# Patient Record
Sex: Male | Born: 1945 | Race: Black or African American | Hispanic: No | Marital: Married | Smoking: Current every day smoker
Health system: Southern US, Community
[De-identification: ages and names within clinical notes are randomized; demographics above are authoritative.]

## PROBLEM LIST (undated history)

## (undated) DIAGNOSIS — J449 Chronic obstructive pulmonary disease, unspecified: Secondary | ICD-10-CM

## (undated) DIAGNOSIS — I10 Essential (primary) hypertension: Secondary | ICD-10-CM

---

## 2018-03-19 ENCOUNTER — Other Ambulatory Visit: Payer: Self-pay

## 2018-03-19 ENCOUNTER — Emergency Department: Payer: Commercial Managed Care - PPO

## 2018-03-19 ENCOUNTER — Encounter: Payer: Self-pay | Admitting: Emergency Medicine

## 2018-03-19 ENCOUNTER — Observation Stay
Admission: EM | Admit: 2018-03-19 | Discharge: 2018-03-21 | Disposition: A | Discharge status: Home / Self Care | Payer: Commercial Managed Care - PPO | Attending: Internal Medicine | Admitting: Internal Medicine

## 2018-03-19 DIAGNOSIS — Z7982 Long term (current) use of aspirin: Secondary | ICD-10-CM | POA: Diagnosis not present

## 2018-03-19 DIAGNOSIS — R0902 Hypoxemia: Secondary | ICD-10-CM

## 2018-03-19 DIAGNOSIS — Z79899 Other long term (current) drug therapy: Secondary | ICD-10-CM | POA: Diagnosis not present

## 2018-03-19 DIAGNOSIS — E785 Hyperlipidemia, unspecified: Secondary | ICD-10-CM | POA: Diagnosis not present

## 2018-03-19 DIAGNOSIS — J101 Influenza due to other identified influenza virus with other respiratory manifestations: Secondary | ICD-10-CM | POA: Insufficient documentation

## 2018-03-19 DIAGNOSIS — J441 Chronic obstructive pulmonary disease with (acute) exacerbation: Secondary | ICD-10-CM | POA: Diagnosis present

## 2018-03-19 DIAGNOSIS — R0600 Dyspnea, unspecified: Secondary | ICD-10-CM

## 2018-03-19 DIAGNOSIS — I129 Hypertensive chronic kidney disease with stage 1 through stage 4 chronic kidney disease, or unspecified chronic kidney disease: Secondary | ICD-10-CM | POA: Diagnosis not present

## 2018-03-19 DIAGNOSIS — F17200 Nicotine dependence, unspecified, uncomplicated: Secondary | ICD-10-CM | POA: Insufficient documentation

## 2018-03-19 DIAGNOSIS — N183 Chronic kidney disease, stage 3 (moderate): Secondary | ICD-10-CM | POA: Diagnosis not present

## 2018-03-19 HISTORY — DX: Chronic obstructive pulmonary disease, unspecified: J44.9

## 2018-03-19 HISTORY — DX: Essential (primary) hypertension: I10

## 2018-03-19 LAB — COMPREHENSIVE METABOLIC PANEL
ALT: 9 U/L (ref 0–44)
AST: 18 U/L (ref 15–41)
Albumin: 3.2 g/dL — ABNORMAL LOW (ref 3.5–5.0)
Alkaline Phosphatase: 70 U/L (ref 38–126)
Anion gap: 6 (ref 5–15)
BUN: 20 mg/dL (ref 8–23)
CO2: 30 mmol/L (ref 22–32)
Calcium: 8.2 mg/dL — ABNORMAL LOW (ref 8.9–10.3)
Chloride: 105 mmol/L (ref 98–111)
Creatinine, Ser: 1.46 mg/dL — ABNORMAL HIGH (ref 0.61–1.24)
GFR calc Af Amer: 56 mL/min — ABNORMAL LOW (ref >60)
GFR calc non Af Amer: 48 mL/min — ABNORMAL LOW (ref >60)
GLUCOSE: 110 mg/dL — AB (ref 70–99)
Potassium: 2.9 mmol/L — ABNORMAL LOW (ref 3.5–5.1)
Sodium: 141 mmol/L (ref 135–145)
Total Bilirubin: 1.1 mg/dL (ref 0.3–1.2)
Total Protein: 7.1 g/dL (ref 6.5–8.1)

## 2018-03-19 LAB — CBC WITH DIFFERENTIAL/PLATELET
Abs Immature Granulocytes: 0.03 10*3/uL (ref 0.00–0.07)
Basophils Absolute: 0 10*3/uL (ref 0.0–0.1)
Basophils Relative: 0 %
Eosinophils Absolute: 0.1 10*3/uL (ref 0.0–0.5)
Eosinophils Relative: 1 %
HCT: 48.1 % (ref 39.0–52.0)
Hemoglobin: 15.7 g/dL (ref 13.0–17.0)
IMMATURE GRANULOCYTES: 1 %
Lymphocytes Relative: 17 %
Lymphs Abs: 1.1 10*3/uL (ref 0.7–4.0)
MCH: 27.5 pg (ref 26.0–34.0)
MCHC: 32.6 g/dL (ref 30.0–36.0)
MCV: 84.4 fL (ref 80.0–100.0)
Monocytes Absolute: 0.7 10*3/uL (ref 0.1–1.0)
Monocytes Relative: 10 %
NEUTROS ABS: 4.7 10*3/uL (ref 1.7–7.7)
Neutrophils Relative %: 71 %
Platelets: 237 10*3/uL (ref 150–400)
RBC: 5.7 MIL/uL (ref 4.22–5.81)
RDW: 14.4 % (ref 11.5–15.5)
WBC: 6.6 10*3/uL (ref 4.0–10.5)
nRBC: 0 % (ref 0.0–0.2)

## 2018-03-19 LAB — INFLUENZA PANEL BY PCR (TYPE A & B)
Influenza A By PCR: POSITIVE — AB
Influenza B By PCR: NEGATIVE

## 2018-03-19 LAB — TROPONIN I: Troponin I: 0.04 ng/mL (ref <0.03)

## 2018-03-19 MED ORDER — ENOXAPARIN SODIUM 40 MG/0.4ML ~~LOC~~ SOLN
40.0000 mg | SUBCUTANEOUS | Status: DC
  Administered 2018-03-19 – 2018-03-20 (×2): 40 mg via SUBCUTANEOUS
  Filled 2018-03-19 (×2): qty 0.4

## 2018-03-19 MED ORDER — AMLODIPINE BESYLATE 10 MG PO TABS
10.0000 mg | ORAL_TABLET | Freq: Every day | ORAL | Status: DC
  Administered 2018-03-20 – 2018-03-21 (×2): 10 mg via ORAL
  Filled 2018-03-19 (×2): qty 1

## 2018-03-19 MED ORDER — PRAVASTATIN SODIUM 20 MG PO TABS
40.0000 mg | ORAL_TABLET | Freq: Every day | ORAL | Status: DC
  Administered 2018-03-19 – 2018-03-20 (×2): 40 mg via ORAL
  Filled 2018-03-19 (×2): qty 2

## 2018-03-19 MED ORDER — POTASSIUM CHLORIDE CRYS ER 20 MEQ PO TBCR
40.0000 meq | EXTENDED_RELEASE_TABLET | ORAL | Status: AC
  Administered 2018-03-19 (×2): 40 meq via ORAL
  Filled 2018-03-19 (×2): qty 2

## 2018-03-19 MED ORDER — METHYLPREDNISOLONE SODIUM SUCC 125 MG IJ SOLR
125.0000 mg | Freq: Once | INTRAMUSCULAR | Status: AC
  Administered 2018-03-19: 125 mg via INTRAVENOUS
  Filled 2018-03-19: qty 2

## 2018-03-19 MED ORDER — METOPROLOL TARTRATE 50 MG PO TABS
50.0000 mg | ORAL_TABLET | Freq: Two times a day (BID) | ORAL | Status: DC
  Administered 2018-03-19 – 2018-03-21 (×4): 50 mg via ORAL
  Filled 2018-03-19 (×4): qty 1

## 2018-03-19 MED ORDER — IPRATROPIUM BROMIDE 0.02 % IN SOLN
0.5000 mg | Freq: Four times a day (QID) | RESPIRATORY_TRACT | Status: DC
  Administered 2018-03-19: 0.5 mg via RESPIRATORY_TRACT
  Filled 2018-03-19: qty 2.5

## 2018-03-19 MED ORDER — POLYETHYLENE GLYCOL 3350 17 G PO PACK: 17.0000 g | PACK | Freq: Every day | ORAL | Status: DC | PRN

## 2018-03-19 MED ORDER — ACETAMINOPHEN 650 MG RE SUPP: 650.0000 mg | Freq: Four times a day (QID) | RECTAL | Status: DC | PRN

## 2018-03-19 MED ORDER — LISINOPRIL 20 MG PO TABS
20.0000 mg | ORAL_TABLET | Freq: Every day | ORAL | Status: DC
  Administered 2018-03-20 – 2018-03-21 (×2): 20 mg via ORAL
  Filled 2018-03-19 (×2): qty 1

## 2018-03-19 MED ORDER — ASPIRIN EC 325 MG PO TBEC
325.0000 mg | DELAYED_RELEASE_TABLET | Freq: Every day | ORAL | Status: DC
  Administered 2018-03-20 – 2018-03-21 (×2): 325 mg via ORAL
  Filled 2018-03-19 (×2): qty 1

## 2018-03-19 MED ORDER — IBUPROFEN 400 MG PO TABS: 400.0000 mg | ORAL_TABLET | Freq: Four times a day (QID) | ORAL | Status: DC | PRN

## 2018-03-19 MED ORDER — ONDANSETRON HCL 4 MG PO TABS: 4.0000 mg | ORAL_TABLET | Freq: Four times a day (QID) | ORAL | Status: DC | PRN

## 2018-03-19 MED ORDER — IPRATROPIUM-ALBUTEROL 0.5-2.5 (3) MG/3ML IN SOLN
3.0000 mL | Freq: Once | RESPIRATORY_TRACT | Status: AC
  Administered 2018-03-19: 3 mL via RESPIRATORY_TRACT
  Filled 2018-03-19: qty 3

## 2018-03-19 MED ORDER — ONDANSETRON HCL 4 MG/2ML IJ SOLN: 4.0000 mg | Freq: Four times a day (QID) | INTRAMUSCULAR | Status: DC | PRN

## 2018-03-19 MED ORDER — HYDRALAZINE HCL 50 MG PO TABS
100.0000 mg | ORAL_TABLET | Freq: Three times a day (TID) | ORAL | Status: DC
  Administered 2018-03-19 – 2018-03-21 (×7): 100 mg via ORAL
  Filled 2018-03-19 (×7): qty 2

## 2018-03-19 MED ORDER — IPRATROPIUM-ALBUTEROL 0.5-2.5 (3) MG/3ML IN SOLN
3.0000 mL | Freq: Four times a day (QID) | RESPIRATORY_TRACT | Status: DC
  Administered 2018-03-19 – 2018-03-21 (×9): 3 mL via RESPIRATORY_TRACT
  Filled 2018-03-19 (×9): qty 3

## 2018-03-19 MED ORDER — METHYLPREDNISOLONE SODIUM SUCC 125 MG IJ SOLR
60.0000 mg | Freq: Two times a day (BID) | INTRAMUSCULAR | Status: DC
  Administered 2018-03-19 – 2018-03-21 (×4): 60 mg via INTRAVENOUS
  Filled 2018-03-19 (×4): qty 2

## 2018-03-19 MED ORDER — ACETAMINOPHEN 325 MG PO TABS: 650.0000 mg | ORAL_TABLET | Freq: Four times a day (QID) | ORAL | Status: DC | PRN

## 2018-03-19 NOTE — ED Notes (Addendum)
Report to receiving nurse [facility] RN

## 2018-03-19 NOTE — ED Notes (Signed)
Pt taken off nasal cannula O2, will monitor O2 sat.

## 2018-03-19 NOTE — ED Notes (Signed)
Pt O2 sat dropped to 87% on RA. Nasal cannula replaced at 2lpm with sat increase to 94%. EDP Paduchowksi notified.

## 2018-03-19 NOTE — ED Provider Notes (Signed)
[facility] Emergency Department Provider Note  Time seen: 8:14 AM  I have reviewed the triage vital signs and the nursing notes.   HISTORY  Chief Complaint Shortness of Breath    HPI Harry Francis is a 73 y.o. male with a past medical history of COPD, hypertension, presents to the emergency department for shortness of breath.  According to the patient he stays fairly short of breath, but over the past several days has become progressively more short of breath.  Does not wear oxygen at home.  States he was diagnosed with COPD several years ago and was prescribed an inhaler but did not get any refills.  Patient denies any chest pain.  States cough but states this is chronic.  Denies any increased sputum production.  Denies any leg pain or swelling.  Describes her shortness of breath as moderate.   Past Medical History:  Diagnosis Date  . COPD (chronic obstructive pulmonary disease) (HCC)   . Hypertension     There are no active problems to display for this patient.   History reviewed. No pertinent surgical history.  Prior to Admission medications   Not on File    Not on File  No family history on file.  Social History Social History   Tobacco Use  . Smoking status: Current Every Day Smoker  . Smokeless tobacco: Current User  Substance Use Topics  . Alcohol use: Not Currently  . Drug use: Not on file    Review of Systems Constitutional: Negative for fever. Cardiovascular: Negative for chest pain. Respiratory: Positive for shortness of breath.  Positive for cough. Gastrointestinal: Negative for abdominal pain, vomiting  Musculoskeletal: Negative for leg pain or swelling Skin: Negative for skin complaints  Neurological: Negative for headache All other ROS negative  ____________________________________________   PHYSICAL EXAM:  VITAL SIGNS: ED Triage Vitals [03/19/18 0750]  Enc Vitals Group     BP (!) 159/64     Pulse Rate 66   Resp 20     Temp 97.6 F (36.4 C)     Temp Source Oral     SpO2 (!) 87 %     Weight 155 lb (70.3 kg)     Height 5\' 6"  (1.676 m)     Head Circumference      Peak Flow      Pain Score 0     Pain Loc      Pain Edu?      Excl. in GC?    Constitutional: Alert and oriented. Well appearing and in no distress. Eyes: Normal exam ENT   Head: Normocephalic and atraumatic.   Mouth/Throat: Mucous membranes are moist. Cardiovascular: Normal rate, regular rhythm. Respiratory: Mild tachypnea.  Patient does have expiratory wheeze bilaterally, mild rhonchi bilaterally.  Occasional cough during exam. Gastrointestinal: Soft and nontender. No distention.   Musculoskeletal: Nontender with normal range of motion in all extremities. No lower extremity tenderness or edema. Neurologic:  Normal speech and language. No gross focal neurologic deficit Skin:  Skin is warm, dry and intact.  Psychiatric: Mood and affect are normal.   ____________________________________________    EKG  EKG viewed and interpreted by myself shows a sinus rhythm at 64 bpm with a narrow QRS, normal axis, normal intervals, nonspecific ST changes without ST elevation  ____________________________________________    RADIOLOGY  IMPRESSION: Lungs mildly hyperexpanded with scattered areas of scarring. No edema or consolidation. Heart size normal.  Aortic Atherosclerosis (ICD10-I70.0).  ____________________________________________   INITIAL IMPRESSION / ASSESSMENT  AND PLAN / ED COURSE  Pertinent labs & imaging results that were available during my care of the patient were reviewed by me and considered in my medical decision making (see chart for details).  Patient presents to the emergency department with shortness of breath progressively worsening.  Occasional cough.  Patient has a history of COPD does not wear oxygen, currently on 2 L nasal cannula satting in the upper 90s.  Patient does have expiratory wheeze  bilaterally as well as mild rhonchi.  We will obtain labs, chest x-ray, treat with duo nebs and Solu-Medrol.  Differential at this time would include COPD exacerbation, pneumonia, URI, ACS.  Patient's labs show a slight troponin elevation 0.04.  Labs otherwise are largely nonrevealing, no old labs for comparison for his minor renal dysfunction.  Patient was taken off of oxygen desats once again to 87/88%.  X-ray is clear.  Clinical presentation very consistent with COPD exacerbation.  We will admit to the hospitalist service for continued treatment.  Patient agreeable plan of care.  ____________________________________________   FINAL CLINICAL IMPRESSION(S) / ED DIAGNOSES  Dyspnea COPD exacerbation   Minna Antis, MD 03/19/18 [phone]

## 2018-03-19 NOTE — Progress Notes (Signed)
Advance care planning  Purpose of Encounter COPD, CODE STATUS discussion  Parties in Attendance Patient  Patients Decisional capacity Alert and oriented.  Able to make medical decisions  Patient's documented healthcare power of attorney is his wife Isidore, Franc  Discussed regarding COPD, exacerbation, need for admission.  Treatment and prognosis discussed.  CODE STATUS discussed and patient wishes to be a full code.  Has not had this discussion with his wife and has no documentation place.  Encouraged to have this done.  Time spent - 17 minutes

## 2018-03-19 NOTE — H&P (Signed)
[facility] [place] [facility]   PATIENT NAME: Harry Francis    MR#:  [id number]  DATE OF BIRTH:  1945-07-31  DATE OF ADMISSION:  03/19/2018  PRIMARY CARE PHYSICIAN: System, Pcp Not In   REQUESTING/REFERRING PHYSICIAN: Dr. Lenard Lance  CHIEF COMPLAINT:   Chief Complaint  Patient presents with  . Shortness of Breath    HISTORY OF PRESENT ILLNESS:  Harry Francis  is a 73 y.o. 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  He tells me his family went through series of colds and finally he caught this.  Today he saturations were found to be in the low 80s.  Try to wean off and oxygen was 86% on room air.  He has had clear sputum.  No chest pain or orthopnea.  No lower extremity edema.  He continues to smoke but trying to decrease it.  Has diagnosis of COPD.  Not on inhalers. Chest x-ray shows no infiltrates.  Afebrile.  Normal WBC.  PAST MEDICAL HISTORY:   Past Medical History:  Diagnosis Date  . COPD (chronic obstructive pulmonary disease) (HCC)   . Hypertension     PAST SURGICAL HISTORY:  History reviewed. No pertinent surgical history.  SOCIAL HISTORY:   Social History   Tobacco Use  . Smoking status: Current Every Day Smoker  . Smokeless tobacco: Current User  Substance Use Topics  . Alcohol use: Not Currently    FAMILY HISTORY:  No family history on file. No family history of lung cancer  DRUG ALLERGIES:  No Known Allergies  REVIEW OF SYSTEMS:   Review of Systems  Constitutional: Positive for malaise/fatigue. Negative for chills and fever.  HENT: Negative for sore throat.   Eyes: Negative for blurred vision, double vision and pain.  Respiratory: Positive for cough, sputum production, shortness of breath and wheezing. Negative for hemoptysis.   Cardiovascular: Negative  for chest pain, palpitations, orthopnea and leg swelling.  Gastrointestinal: Negative for abdominal pain, constipation, diarrhea, heartburn, nausea and vomiting.  Genitourinary: Negative for dysuria and hematuria.  Musculoskeletal: Negative for back pain and joint pain.  Skin: Negative for rash.  Neurological: Negative for sensory change, speech change, focal weakness and headaches.  Endo/Heme/Allergies: Does not bruise/bleed easily.  Psychiatric/Behavioral: Negative for depression. The patient is not nervous/anxious.     MEDICATIONS AT HOME:   Prior to Admission medications   Medication Sig Start Date End Date Taking? Authorizing Provider  amLODipine (NORVASC) 10 MG tablet Take 10 mg by mouth daily. 01/02/18  Yes [provider]  aspirin 325 MG tablet Take 325 mg by mouth daily.   Yes [provider]  hydrALAZINE (APRESOLINE) 100 MG tablet Take 100 mg by mouth 3 (three) times daily.   Yes [provider]  lisinopril (PRINIVIL,ZESTRIL) 20 MG tablet Take 20 mg by mouth daily. 01/02/18  Yes [provider]  metoprolol tartrate (LOPRESSOR) 50 MG tablet Take 50 mg by mouth 2 (two) times daily. 01/02/18  Yes [provider]  pravastatin (PRAVACHOL) 40 MG tablet Take 40 mg by mouth at bedtime. 01/02/18  Yes [provider]     VITAL SIGNS:  Blood pressure (!) 146/79, pulse 75, temperature 97.6 F (36.4 C), temperature source Oral, resp. rate 19, height 5\' 6"  (1.676 m), weight 70.3 kg, SpO2 (!) 88 %.  PHYSICAL EXAMINATION:  Physical Exam  GENERAL:  73 y.o.-year-old patient lying in the bed with conversational dyspnea EYES: Pupils equal, round, reactive to light and accommodation. No scleral icterus. Extraocular muscles intact.  HEENT: Head atraumatic, normocephalic. Oropharynx and nasopharynx clear. No oropharyngeal erythema, moist oral mucosa  NECK:  Supple, no jugular venous distention. No thyroid enlargement, no tenderness.  LUNGS:  Bilateral wheezing and decreased air entry.   CARDIOVASCULAR: S1, S2 normal. No murmurs, rubs, or gallops.  ABDOMEN: Soft, nontender, nondistended. Bowel sounds present. No organomegaly or mass.  EXTREMITIES: No pedal edema, cyanosis, or clubbing. + 2 pedal & radial pulses b/l.   NEUROLOGIC: Cranial nerves II through XII are intact. No focal Motor or sensory deficits appreciated b/l PSYCHIATRIC: The patient is alert and oriented x 3. Good affect.  SKIN: No obvious rash, lesion, or ulcer.   LABORATORY PANEL:   CBC Recent Labs  Lab 03/19/18 0757  WBC 6.6  HGB 15.7  HCT 48.1  PLT 237   ------------------------------------------------------------------------------------------------------------------  Chemistries  Recent Labs  Lab 03/19/18 0757  NA 141  K 2.9*  CL 105  CO2 30  GLUCOSE 110*  BUN 20  CREATININE 1.46*  CALCIUM 8.2*  AST 18  ALT 9  ALKPHOS 70  BILITOT 1.1   ------------------------------------------------------------------------------------------------------------------  Cardiac Enzymes Recent Labs  Lab 03/19/18 0757  TROPONINI 0.04*   ------------------------------------------------------------------------------------------------------------------  RADIOLOGY:  Dg Chest 2 View  Result Date: 03/19/2018 CLINICAL DATA:  Shortness of breath. EXAM: CHEST - 2 VIEW COMPARISON:  None. FINDINGS: Lungs are mildly hyperexpanded. There are scattered areas of scarring bilaterally. There is no apparent edema or consolidation. Heart size and pulmonary vascularity are normal. No adenopathy. There is aortic atherosclerosis. There is degenerative change in the thoracic spine. IMPRESSION: Lungs mildly hyperexpanded with scattered areas of scarring. No edema or consolidation. Heart size normal. Aortic Atherosclerosis (ICD10-I70.0). Electronically Signed   By: Bretta Bang III M.D.   On: 03/19/2018 08:35     IMPRESSION AND PLAN:   *Acute COPD exacerbation secondary to  bronchitis.  Will check influenza panel.  Isolation. -IV steroids - Scheduled Nebulizers - Inhalers -Wean O2 as tolerated - Consult pulmonary if no improvement -I removed oxygen while been in patient's room and his saturations are 94% on room air.  Will not need oxygen.  Admit under observation.  *Hypertension.  Continue home medications.  IV medications if needed.  *Tobacco abuse.  Counseled to quit smoking for greater than 3 minutes.  DVT prophylaxis with Lovenox   All the records are reviewed and case discussed with ED provider. Management plans discussed with the patient, family and they are in agreement.  CODE STATUS: Full code  TOTAL TIME TAKING CARE OF THIS PATIENT: 40 minutes.   Molinda Bailiff Harry Francis M.D on 03/19/2018 at 12:17 PM  Between 7am to 6pm - Pager - [phone]  After 6pm go to [email or web address] - password [email or web address]  [facility]  Office  [phone]  CC: Primary care physician; System, Pcp Not In  Note: This dictation was prepared with Dragon dictation along with smaller phrase technology. Any transcriptional errors that result from this process are unintentional.

## 2018-03-19 NOTE — Progress Notes (Signed)
He declined interest in AD without his wife present. He grieved about his situation, he said seeing staff wear N95 in his room makes him feel like he is contagious. He said he is lonely and far away from home. Chaplain normalized pt's anxiety and offered active listening and affirmation.   03/19/18 2000  Clinical Encounter Type  Visited With Patient  Visit Type Initial  Referral From Nurse  Spiritual Encounters  Spiritual Needs Emotional;Grief support  Stress Factors  Patient Stress Factors Exhausted;Loss;Loss of control  Advance Directives (For Healthcare)  Does Patient Have a Medical Advance Directive? No  Would patient like information on creating a medical advance directive? No - Patient declined  Mental Health Advance Directives  Does Patient Have a Mental Health Advance Directive? No  Would patient like information on creating a mental health advance directive? No - Patient declined

## 2018-03-19 NOTE — ED Triage Notes (Signed)
Pt arrived via ems with complaints of shortness of breath that started today. Pt given 1 albuterol treatment in route. Pt denies pain. Initial o2 for ems 88%.

## 2018-03-19 NOTE — ED Notes (Signed)
ED TO INPATIENT HANDOFF REPORT  Name/Age/Gender Harry Francis 73 y.o. male  Code Status    Code Status Orders  (From admission, onward)         Start     Ordered   03/19/18 1116  Full code  Continuous     03/19/18 1116        Code Status History    This patient has a current code status but no historical code status.      Home/SNF/Other Patient from   Chief Complaint breathing difficulty  Level of Care/Admitting Diagnosis ED Disposition    ED Disposition Condition Comment   Admit  Hospital Area: [facility] [[id number]]  Level of Care: Med-Surg [16]  Diagnosis: COPD exacerbation [facility]) [[id number]]  Admitting Physician: Milagros Loll [[id number]]  Attending Physician: Milagros Loll [phone]  PT Class (Do Not Modify): Observation [104]  PT Acc Code (Do Not Modify): Observation [10022]       Medical History Past Medical History:  Diagnosis Date  . COPD (chronic obstructive pulmonary disease) (HCC)   . Hypertension     Allergies No Known Allergies  IV Location/Drains/Wounds Patient Lines/Drains/Airways Status   Active Line/Drains/Airways    Name:   Placement date:   Placement time:   Site:   Days:   Peripheral IV 03/19/18 Left Forearm   03/19/18    0759    Forearm   less than 1          Labs/Imaging Results for orders placed or performed during the hospital encounter of 03/19/18 (from the past 48 hour(s))  CBC with Differential     Status: None   Collection Time: 03/19/18  7:57 AM  Result Value Ref Range   WBC 6.6 4.0 - 10.5 K/uL   RBC 5.70 4.22 - 5.81 MIL/uL   Hemoglobin 15.7 13.0 - 17.0 g/dL   HCT 72.0 94.7 - 09.6 %   MCV 84.4 80.0 - 100.0 fL   MCH 27.5 26.0 - 34.0 pg   MCHC 32.6 30.0 - 36.0 g/dL   RDW 28.3 66.2 - 94.7 %   Platelets 237 150 - 400 K/uL   nRBC 0.0 0.0 - 0.2 %   Neutrophils Relative % 71 %   Neutro Abs 4.7 1.7 - 7.7 K/uL   Lymphocytes Relative 17 %   Lymphs Abs 1.1 0.7 - 4.0 K/uL   Monocytes Relative 10 %   Monocytes Absolute 0.7 0.1 - 1.0 K/uL   Eosinophils Relative 1 %   Eosinophils Absolute 0.1 0.0 - 0.5 K/uL   Basophils Relative 0 %   Basophils Absolute 0.0 0.0 - 0.1 K/uL   Immature Granulocytes 1 %   Abs Immature Granulocytes 0.03 0.00 - 0.07 K/uL    Comment: Performed at [facility], [address] [place], [place] [address]  Comprehensive metabolic panel     Status: Abnormal   Collection Time: 03/19/18  7:57 AM  Result Value Ref Range   Sodium 141 135 - 145 mmol/L   Potassium 2.9 (L) 3.5 - 5.1 mmol/L   Chloride 105 98 - 111 mmol/L   CO2 30 22 - 32 mmol/L   Glucose, Bld 110 (H) 70 - 99 mg/dL   BUN 20 8 - 23 mg/dL   Creatinine, Ser 0.35 (H) 0.61 - 1.24 mg/dL   Calcium 8.2 (L) 8.9 - 10.3 mg/dL   Total Protein 7.1 6.5 - 8.1 g/dL   Albumin 3.2 (L) 3.5 - 5.0 g/dL   AST 18 15 -  41 U/L   ALT 9 0 - 44 U/L   Alkaline Phosphatase 70 38 - 126 U/L   Total Bilirubin 1.1 0.3 - 1.2 mg/dL   GFR calc non Af Amer 48 (L) >60 mL/min   GFR calc Af Amer 56 (L) >60 mL/min   Anion gap 6 5 - 15    Comment: Performed at [facility], [address] [place], [place] [address]  Troponin I - ONCE - STAT     Status: Abnormal   Collection Time: 03/19/18  7:57 AM  Result Value Ref Range   Troponin I 0.04 (HH) <0.03 ng/mL    Comment: CRITICAL RESULT CALLED TO, READ BACK BY AND VERIFIED WITH ALICIA GRANGER AT 0855 ON 03/19/2018 SMA Performed at [facility], [address], [place], [place] [address]   Influenza panel by PCR (type A & B)     Status: Abnormal   Collection Time: 03/19/18 11:53 AM  Result Value Ref Range   Influenza A By PCR POSITIVE (A) NEGATIVE   Influenza B By PCR NEGATIVE NEGATIVE    Comment: (NOTE) The Xpert Xpress Flu assay is intended as an aid in the diagnosis of  influenza and should not be used as a sole basis for treatment.  This  assay is FDA approved for nasopharyngeal swab specimens only. Nasal  washings and aspirates are unacceptable for  Xpert Xpress Flu testing. Performed at [facility], [address], [place], [place] [address]    Dg Chest 2 View  Result Date: 03/19/2018 CLINICAL DATA:  Shortness of breath. EXAM: CHEST - 2 VIEW COMPARISON:  None. FINDINGS: Lungs are mildly hyperexpanded. There are scattered areas of scarring bilaterally. There is no apparent edema or consolidation. Heart size and pulmonary vascularity are normal. No adenopathy. There is aortic atherosclerosis. There is degenerative change in the thoracic spine. IMPRESSION: Lungs mildly hyperexpanded with scattered areas of scarring. No edema or consolidation. Heart size normal. Aortic Atherosclerosis (ICD10-I70.0). Electronically Signed   By: Bretta Bang III M.D.   On: 03/19/2018 08:35    Pending Labs Unresulted Labs (From admission, onward)    Start     Ordered   03/26/18 0500  Creatinine, serum  (enoxaparin (LOVENOX)    CrCl >/= 30 ml/min)  Weekly,   STAT    Comments:  while on enoxaparin therapy    03/19/18 1116   03/20/18 0500  Basic metabolic panel  Tomorrow morning,   STAT     03/19/18 1116   03/20/18 0500  CBC  Tomorrow morning,   STAT     03/19/18 1116   03/19/18 1117  HIV antibody (Routine Testing)  Add-on,   AD     03/19/18 1116          Vitals/Pain Today's Vitals   03/19/18 1230 03/19/18 1245 03/19/18 1300 03/19/18 1315  BP: (!) 160/60  (!) 132/32   Pulse: 74 77 79 74  Resp: 19 18 17 20   Temp:      TempSrc:      SpO2: 91% 90% (!) 88% (!) 88%  Weight:      Height:      PainSc:        Isolation Precautions Droplet precaution  Medications Medications  enoxaparin (LOVENOX) injection 40 mg (has no administration in time range)  acetaminophen (TYLENOL) tablet 650 mg (has no administration in time range)    Or  acetaminophen (TYLENOL) suppository 650 mg (has no administration in time range)  ibuprofen (ADVIL,MOTRIN) tablet  400 mg (has no administration in time range)  polyethylene glycol (MIRALAX / GLYCOLAX)  packet 17 g (has no administration in time range)  ondansetron (ZOFRAN) tablet 4 mg (has no administration in time range)    Or  ondansetron (ZOFRAN) injection 4 mg (has no administration in time range)  ipratropium (ATROVENT) nebulizer solution 0.5 mg (0.5 mg Nebulization Given 03/19/18 1202)  ipratropium-albuterol (DUONEB) 0.5-2.5 (3) MG/3ML nebulizer solution 3 mL (3 mLs Nebulization Given 03/19/18 1326)  methylPREDNISolone sodium succinate (SOLU-MEDROL) 125 mg/2 mL injection 60 mg (has no administration in time range)  potassium chloride SA (K-DUR,KLOR-CON) CR tablet 40 mEq (40 mEq Oral Given 03/19/18 1326)  ipratropium-albuterol (DUONEB) 0.5-2.5 (3) MG/3ML nebulizer solution 3 mL (3 mLs Nebulization Given 03/19/18 0900)  ipratropium-albuterol (DUONEB) 0.5-2.5 (3) MG/3ML nebulizer solution 3 mL (3 mLs Nebulization Given 03/19/18 0900)  methylPREDNISolone sodium succinate (SOLU-MEDROL) 125 mg/2 mL injection 125 mg (125 mg Intravenous Given 03/19/18 0859)    Mobility Ambulatory

## 2018-03-19 NOTE — ED Notes (Signed)
Patient transported to X-ray 

## 2018-03-19 NOTE — ED Notes (Signed)
Date and time results received: 03/19/18 8:56 AM  Test: Troponin I Critical Value: 0.04  Name of Provider Notified: Dr. Lenard Lance  Orders Received? Or Actions Taken?: Actions Taken: no new orders at this time

## 2018-03-20 DIAGNOSIS — J441 Chronic obstructive pulmonary disease with (acute) exacerbation: Secondary | ICD-10-CM | POA: Diagnosis not present

## 2018-03-20 LAB — BASIC METABOLIC PANEL
Anion gap: 5 (ref 5–15)
BUN: 31 mg/dL — ABNORMAL HIGH (ref 8–23)
CO2: 28 mmol/L (ref 22–32)
Calcium: 9 mg/dL (ref 8.9–10.3)
Chloride: 107 mmol/L (ref 98–111)
Creatinine, Ser: 1.58 mg/dL — ABNORMAL HIGH (ref 0.61–1.24)
GFR calc Af Amer: 51 mL/min — ABNORMAL LOW (ref >60)
GFR calc non Af Amer: 44 mL/min — ABNORMAL LOW (ref >60)
Glucose, Bld: 146 mg/dL — ABNORMAL HIGH (ref 70–99)
Potassium: 4.4 mmol/L (ref 3.5–5.1)
Sodium: 140 mmol/L (ref 135–145)

## 2018-03-20 LAB — CBC
HCT: 43.9 % (ref 39.0–52.0)
Hemoglobin: 14.6 g/dL (ref 13.0–17.0)
MCH: 27.7 pg (ref 26.0–34.0)
MCHC: 33.3 g/dL (ref 30.0–36.0)
MCV: 83.1 fL (ref 80.0–100.0)
Platelets: 239 10*3/uL (ref 150–400)
RBC: 5.28 MIL/uL (ref 4.22–5.81)
RDW: 14.3 % (ref 11.5–15.5)
WBC: 10.5 10*3/uL (ref 4.0–10.5)
nRBC: 0 % (ref 0.0–0.2)

## 2018-03-20 LAB — HIV ANTIBODY (ROUTINE TESTING W REFLEX): HIV Screen 4th Generation wRfx: NONREACTIVE

## 2018-03-20 MED ORDER — OSELTAMIVIR PHOSPHATE 30 MG PO CAPS
30.0000 mg | ORAL_CAPSULE | Freq: Two times a day (BID) | ORAL | Status: DC
  Administered 2018-03-20 – 2018-03-21 (×2): 30 mg via ORAL
  Filled 2018-03-20 (×3): qty 1

## 2018-03-20 MED ORDER — BUDESONIDE 0.25 MG/2ML IN SUSP
0.2500 mg | Freq: Two times a day (BID) | RESPIRATORY_TRACT | Status: DC
  Administered 2018-03-20 – 2018-03-21 (×2): 0.25 mg via RESPIRATORY_TRACT
  Filled 2018-03-20 (×2): qty 2

## 2018-03-20 MED ORDER — OSELTAMIVIR PHOSPHATE 30 MG PO CAPS
30.0000 mg | ORAL_CAPSULE | Freq: Every day | ORAL | Status: DC
  Filled 2018-03-20: qty 1

## 2018-03-20 MED ORDER — OSELTAMIVIR PHOSPHATE 75 MG PO CAPS: 75.0000 mg | ORAL_CAPSULE | Freq: Two times a day (BID) | ORAL | Status: DC

## 2018-03-20 MED ORDER — GUAIFENESIN ER 600 MG PO TB12
600.0000 mg | ORAL_TABLET | Freq: Two times a day (BID) | ORAL | Status: DC
  Administered 2018-03-20 – 2018-03-21 (×3): 600 mg via ORAL
  Filled 2018-03-20 (×3): qty 1

## 2018-03-20 MED ORDER — TIOTROPIUM BROMIDE MONOHYDRATE 18 MCG IN CAPS
18.0000 ug | ORAL_CAPSULE | Freq: Every morning | RESPIRATORY_TRACT | Status: DC
  Administered 2018-03-20 – 2018-03-21 (×2): 18 ug via RESPIRATORY_TRACT
  Filled 2018-03-20: qty 5

## 2018-03-20 MED ORDER — GUAIFENESIN-DM 100-10 MG/5ML PO SYRP: 15.0000 mL | ORAL_SOLUTION | Freq: Four times a day (QID) | ORAL | Status: DC | PRN

## 2018-03-20 NOTE — Progress Notes (Signed)
SATURATION QUALIFICATIONS: (This note is used to comply with regulatory documentation for home oxygen)  Patient Saturations on Room Air at Rest = 98%  Patient Saturations on Room Air while Ambulating = 89%  Patient Saturations on  Liters of oxygen while Ambulating  Please briefly explain why patient needs home oxygen: 

## 2018-03-20 NOTE — Progress Notes (Signed)
Initial Nutrition Assessment  DOCUMENTATION CODES:   Not applicable  INTERVENTION:  Ensure Enlive po BID, each supplement provides 350 kcal and 20 grams of protein MVI   NUTRITION DIAGNOSIS:   Increased nutrient needs related to acute illness as evidenced by estimated needs.   GOAL:   Patient will meet greater than or equal to 90% of their needs   MONITOR:   PO intake, Supplement acceptance  REASON FOR ASSESSMENT:   Malnutrition Screening Tool    ASSESSMENT:  73 year old male with known history of COPD, HTN, current tobacco use presented to ED with increasing SOB, cough, and wheezing admitted with COPD exacerbation.    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 He reports not feeling well when leaving his home in [place], but stated he assumed he was catching a cold. After pulling into a rest stop in [place] for the evening, he noticed he began wheezing and worsening SOB so he decided to call EMS.  Patient endorses poor PO x 2 days prior to admission d/t SOB and decreased appetite.  Patient reports good PO while on the road, having his own food on the truck and recalls oatmeal, grits, spaghetti and meatballs, and unsalted crackers. He reports that he stops frequently to walk and stretch his legs.   Patient recalls UBW of 180 lbs, denies recent wt changes.  Medications reviewed and include: Mucinex, Methylpridnisolone, Tamiflu  Labs: Influenza A + Glucose 146 (H)  NUTRITION - FOCUSED PHYSICAL EXAM:    Most Recent Value  Orbital Region  Mild depletion  Upper Arm Region  No depletion  Thoracic and Lumbar Region  No depletion  Buccal Region  Mild depletion  Temple Region  No depletion  Clavicle Bone Region  No depletion  Clavicle and Acromion Bone Region  No depletion  Scapular Bone Region  No depletion  Dorsal Hand  No depletion  Patellar Region  Mild depletion  Anterior Thigh Region  No depletion  Posterior Calf Region  No  depletion  Edema (RD Assessment)  None  Hair  Reviewed  Eyes  Reviewed  Mouth  Reviewed  Skin  Reviewed  Nails  Reviewed       Diet Order:   Diet Order            Diet Heart Room service appropriate? Yes; Fluid consistency: Thin  Diet effective now              EDUCATION NEEDS:   No education needs have been identified at this time  Skin:  Skin Assessment: Reviewed RN Assessment  Last BM:  2/12  Height:   Ht Readings from Last 1 Encounters:  03/19/18 5\' 6"  (1.676 m)    Weight:   Wt Readings from Last 1 Encounters:  03/19/18 70.3 kg    Ideal Body Weight:  64.5 kg  BMI:  Body mass index is 25.02 kg/m.  Estimated Nutritional Needs:   Kcal:  5573-2202  Protein:  77-85 grams  Fluid:  2L/day    Lars Masson, RD, LDN  After Hours/Weekend Pager: [phone]

## 2018-03-20 NOTE — Progress Notes (Signed)
[employer] - [place] at [facility]                                                                                                                                                                                  Patient Demographics   Harry Francis, is a 73 y.o. male, DOB - 10-29-1945, GUY:[id number]  Admit date - 03/19/2018   Admitting Physician Milagros Loll, MD  Outpatient Primary MD for the patient is System, Pcp Not In   LOS - 0  Subjective: Patient doing better, his shortness of breath improved    Review of Systems:   CONSTITUTIONAL: No documented fever. No fatigue, weakness. No weight gain, no weight loss.  EYES: No blurry or double vision.  ENT: No tinnitus. No postnasal drip. No redness of the oropharynx.  RESPIRATORY: No cough, no wheeze, no hemoptysis.  Positive dyspnea.  CARDIOVASCULAR: No chest pain. No orthopnea. No palpitations. No syncope.  GASTROINTESTINAL: No nausea, no vomiting or diarrhea. No abdominal pain. No melena or hematochezia.  GENITOURINARY: No dysuria or hematuria.  ENDOCRINE: No polyuria or nocturia. No heat or cold intolerance.  HEMATOLOGY: No anemia. No bruising. No bleeding.  INTEGUMENTARY: No rashes. No lesions.  MUSCULOSKELETAL: No arthritis. No swelling. No gout.  NEUROLOGIC: No numbness, tingling, or ataxia. No seizure-type activity.  PSYCHIATRIC: No anxiety. No insomnia. No ADD.    Vitals:   Vitals:   03/19/18 2030 03/20/18 0135 03/20/18 0448 03/20/18 0811  BP:   (!) 170/65   Pulse:   72   Resp:   18   Temp:   98.8 F (37.1 C)   TempSrc:   Oral   SpO2: 95% 97% 94% 94%  Weight:      Height:        Wt Readings from Last 3 Encounters:  03/19/18 70.3 kg     Intake/Output Summary (Last 24 hours) at 03/20/2018 1455 Last data filed at 03/20/2018 0900 Gross per 24 hour  Intake 240 ml  Output 400 ml  Net -160 ml    Physical Exam:   GENERAL: Pleasant-appearing in no apparent distress.  HEAD, EYES, EARS, NOSE  AND THROAT: Atraumatic, normocephalic. Extraocular muscles are intact. Pupils equal and reactive to light. Sclerae anicteric. No conjunctival injection. No oro-pharyngeal erythema.  NECK: Supple. There is no jugular venous distention. No bruits, no lymphadenopathy, no thyromegaly.  HEART: Regular rate and rhythm,. No murmurs, no rubs, no clicks.  LUNGS: Bilateral wheezing throughout both lung ABDOMEN: Soft, flat, nontender, nondistended. Has good bowel sounds. No hepatosplenomegaly appreciated.  EXTREMITIES: No evidence of any cyanosis, clubbing, or peripheral edema.  +2 pedal and radial pulses bilaterally.  NEUROLOGIC: The patient is alert,  awake, and oriented x3 with no focal motor or sensory deficits appreciated bilaterally.  SKIN: Moist and warm with no rashes appreciated.  Psych: Not anxious, depressed LN: No inguinal LN enlargement    Antibiotics   Anti-infectives (From admission, onward)   Start     Dose/Rate Route Frequency Ordered Stop   03/20/18 2200  oseltamivir (TAMIFLU) capsule 30 mg     30 mg Oral 2 times daily 03/20/18 1015     03/20/18 1000  oseltamivir (TAMIFLU) capsule 75 mg  Status:  Discontinued     75 mg Oral 2 times daily 03/20/18 0900 03/20/18 0929   03/20/18 1000  oseltamivir (TAMIFLU) capsule 30 mg  Status:  Discontinued     30 mg Oral Daily 03/20/18 0930 03/20/18 1015      Medications   Scheduled Meds: . amLODipine  10 mg Oral Daily  . aspirin EC  325 mg Oral Daily  . budesonide (PULMICORT) nebulizer solution  0.25 mg Nebulization BID  . enoxaparin (LOVENOX) injection  40 mg Subcutaneous Q24H  . guaiFENesin  600 mg Oral BID  . hydrALAZINE  100 mg Oral TID  . ipratropium-albuterol  3 mL Nebulization Q6H  . lisinopril  20 mg Oral Daily  . methylPREDNISolone (SOLU-MEDROL) injection  60 mg Intravenous Q12H  . metoprolol tartrate  50 mg Oral BID  . oseltamivir  30 mg Oral BID  . pravastatin  40 mg Oral QHS  . tiotropium  18 mcg Inhalation q morning - 10a    Continuous Infusions: PRN Meds:.acetaminophen **OR** acetaminophen, guaiFENesin-dextromethorphan, ibuprofen, ondansetron **OR** ondansetron (ZOFRAN) IV, polyethylene glycol   Data Review:   Micro Results No results found for this or any previous visit (from the past 240 hour(s)).  Radiology Reports Dg Chest 2 View  Result Date: 03/19/2018 CLINICAL DATA:  Shortness of breath. EXAM: CHEST - 2 VIEW COMPARISON:  None. FINDINGS: Lungs are mildly hyperexpanded. There are scattered areas of scarring bilaterally. There is no apparent edema or consolidation. Heart size and pulmonary vascularity are normal. No adenopathy. There is aortic atherosclerosis. There is degenerative change in the thoracic spine. IMPRESSION: Lungs mildly hyperexpanded with scattered areas of scarring. No edema or consolidation. Heart size normal. Aortic Atherosclerosis (ICD10-I70.0). Electronically Signed   By: Bretta Bang III M.D.   On: 03/19/2018 08:35     CBC Recent Labs  Lab 03/19/18 0757 03/20/18 0538  WBC 6.6 10.5  HGB 15.7 14.6  HCT 48.1 43.9  PLT 237 239  MCV 84.4 83.1  MCH 27.5 27.7  MCHC 32.6 33.3  RDW 14.4 14.3  LYMPHSABS 1.1  --   MONOABS 0.7  --   EOSABS 0.1  --   BASOSABS 0.0  --     Chemistries  Recent Labs  Lab 03/19/18 0757 03/20/18 0538  NA 141 140  K 2.9* 4.4  CL 105 107  CO2 30 28  GLUCOSE 110* 146*  BUN 20 31*  CREATININE 1.46* 1.58*  CALCIUM 8.2* 9.0  AST 18  --   ALT 9  --   ALKPHOS 70  --   BILITOT 1.1  --    ------------------------------------------------------------------------------------------------------------------ estimated creatinine clearance is 39.3 mL/min (A) (by C-G formula based on SCr of 1.58 mg/dL (H)). ------------------------------------------------------------------------------------------------------------------ No results for input(s): HGBA1C in the last 72  hours. ------------------------------------------------------------------------------------------------------------------ No results for input(s): CHOL, HDL, LDLCALC, TRIG, CHOLHDL, LDLDIRECT in the last 72 hours. ------------------------------------------------------------------------------------------------------------------ No results for input(s): TSH, T4TOTAL, T3FREE, THYROIDAB in the last 72 hours.  Invalid input(s): FREET3 ------------------------------------------------------------------------------------------------------------------  No results for input(s): VITAMINB12, FOLATE, FERRITIN, TIBC, IRON, RETICCTPCT in the last 72 hours.  Coagulation profile No results for input(s): INR, PROTIME in the last 168 hours.  No results for input(s): DDIMER in the last 72 hours.  Cardiac Enzymes Recent Labs  Lab 03/19/18 0757  TROPONINI 0.04*   ------------------------------------------------------------------------------------------------------------------ Invalid input(s): POCBNP    Assessment & Plan   *Acute COPD exacerbation secondary to bronchitis.  -Continue IV steroids - Scheduled Nebulizers - Inhalers -Wean O2 as tolerated -He will need follow-up with pulmonologist as outpatient to get PFTs  *Influenza a continue I have placed patient on Tamiflu  *Hypertension.  Continue lisinopril and metoprolol  *Tobacco abuse.  Patient recommended to stop smoking  *Hypokalemia replaced  *Hyperlipidemia continue Pravachol  *Likely chronic kidney disease function     Code Status Orders  (From admission, onward)         Start     Ordered   03/19/18 1116  Full code  Continuous     03/19/18 1116        Code Status History    This patient has a current code status but no historical code status.           Consults none  DVT Prophylaxis  Lovenox  Lab Results  Component Value Date   PLT 239 03/20/2018     Time Spent in minutes 35min Greater than 50% of  time spent in care coordination and counseling patient regarding the condition and plan of care.   Auburn BilberryShreyang Rosenda Geffrard M.D on 03/20/2018 at 2:55 PM  Between 7am to 6pm - Pager - [phone]  After 6pm go to [email or web address] - Social research officer, governmentpassword [employer] [facility]  [employer]   Office  [phone]

## 2018-03-20 NOTE — Plan of Care (Signed)
  Problem: Clinical Measurements: Goal: Ability to maintain clinical measurements within normal limits will improve Outcome: Progressing Goal: Will remain free from infection Outcome: Progressing Goal: Diagnostic test results will improve Outcome: Progressing Goal: Respiratory complications will improve Outcome: Progressing Goal: Cardiovascular complication will be avoided Outcome: Progressing   Problem: Activity: Goal: Risk for activity intolerance will decrease Outcome: Progressing  Patient ambulating without issues. Saturations maintaining.

## 2018-03-21 DIAGNOSIS — J441 Chronic obstructive pulmonary disease with (acute) exacerbation: Secondary | ICD-10-CM | POA: Diagnosis not present

## 2018-03-21 MED ORDER — TIOTROPIUM BROMIDE MONOHYDRATE 18 MCG IN CAPS: 18.0000 ug | ORAL_CAPSULE | Freq: Every morning | RESPIRATORY_TRACT | 0 refills | Status: AC

## 2018-03-21 MED ORDER — ALBUTEROL SULFATE HFA 108 (90 BASE) MCG/ACT IN AERS: 2.0000 | INHALATION_SPRAY | Freq: Four times a day (QID) | RESPIRATORY_TRACT | 0 refills | Status: AC | PRN

## 2018-03-21 MED ORDER — OSELTAMIVIR PHOSPHATE 30 MG PO CAPS: 30.0000 mg | ORAL_CAPSULE | Freq: Two times a day (BID) | ORAL | 0 refills | Status: AC

## 2018-03-21 MED ORDER — PREDNISONE 50 MG PO TABS: 50.0000 mg | ORAL_TABLET | Freq: Every day | ORAL | 0 refills | Status: AC

## 2018-03-21 NOTE — Discharge Instructions (Signed)
It was so nice to meet you during this hospitalization!  You came into the hospital with shortness of breath. We treated you for the flu and for COPD.  For the flu, please take tamiflu 30mg  twice a day for 4 more days (starting this evening) For the COPD, please take prednisone 50mg  daily for 2 more days (starting tomorrow morning)  I have started two inhalers to help your breathing: 1. Take Spiriva 1 inhalation every day, no matter how your breathing is (this will help prevent you from being hospitalized for breathing issues in the future 2. Take Albuterol 2 puffs every 4-6 hours as needed for shortness of breath.  Please make sure you follow-up with your primary care doctor in the next 5 days.  Take care, Dr. Nancy Marus

## 2018-03-21 NOTE — Discharge Summary (Signed)
[employer] [facility] - [place] at [facility]   PATIENT NAME: Harry Francis    MR#:  [id number]  DATE OF BIRTH:  Mar 15, 1945  DATE OF ADMISSION:  03/19/2018   ADMITTING PHYSICIAN: Milagros Loll, MD  DATE OF DISCHARGE: 03/21/18  PRIMARY CARE PHYSICIAN: System, Pcp Not In   ADMISSION DIAGNOSIS:  Hypoxia [R09.02] COPD exacerbation (HCC) [J44.1] Dyspnea, unspecified type [R06.00] DISCHARGE DIAGNOSIS:  Active Problems:   COPD exacerbation (HCC)  SECONDARY DIAGNOSIS:   Past Medical History:  Diagnosis Date  . COPD (chronic obstructive pulmonary disease) (HCC)   . Hypertension    HOSPITAL COURSE:   [name] is a 73 year old male who presented to the ED with shortness of breath, cough, and wheezing.  He tested positive for influenza A, and was also felt to have a COPD exacerbation.  He was admitted for further management.  Acute COPD exacerbation- improved -Initially treated with IV Solu-Medrol and then transitioned to prednisone for a total 5-day course -Started on Spiriva 1 puff daily -Able to be weaned off oxygen -Ambulated with pulse ox prior to discharge and was able to maintain O2 saturations >89% on room air -He will need follow-up with pulmonologist as outpatient to get PFTs  Influenza A -Treated with tamiflu for a total 5-day course  Hypertension- well-controlled -Continued lisinopril and metoprolol  Tobacco abuse -Tobacco cessation counseling performed this admission  Hyperlipidemia- stable -Continued pravachol  CKD III- creatinine stable -Monitor as an outpatient  DISCHARGE CONDITIONS:  COPD Influenza A Hypertension Tobacco abuse Hyperlipidemia CKD 3 CONSULTS OBTAINED:  None DRUG ALLERGIES:  No Known Allergies DISCHARGE MEDICATIONS:   Allergies as of 03/21/2018   No Known Allergies     Medication List    TAKE these medications   albuterol 108 (90 Base) MCG/ACT inhaler Commonly known as:  PROVENTIL HFA;VENTOLIN HFA Inhale 2  puffs into the lungs every 6 (six) hours as needed for wheezing or shortness of breath.   amLODipine 10 MG tablet Commonly known as:  NORVASC Take 10 mg by mouth daily.   aspirin 325 MG tablet Take 325 mg by mouth daily.   hydrALAZINE 100 MG tablet Commonly known as:  APRESOLINE Take 100 mg by mouth 3 (three) times daily.   lisinopril 20 MG tablet Commonly known as:  PRINIVIL,ZESTRIL Take 20 mg by mouth daily.   metoprolol tartrate 50 MG tablet Commonly known as:  LOPRESSOR Take 50 mg by mouth 2 (two) times daily.   oseltamivir 30 MG capsule Commonly known as:  TAMIFLU Take 1 capsule (30 mg total) by mouth 2 (two) times daily.   pravastatin 40 MG tablet Commonly known as:  PRAVACHOL Take 40 mg by mouth at bedtime.   predniSONE 50 MG tablet Commonly known as:  DELTASONE Take 1 tablet (50 mg total) by mouth daily. Start taking on:  March 22, 2018   tiotropium 18 MCG inhalation capsule Commonly known as:  SPIRIVA Place 1 capsule (18 mcg total) into inhaler and inhale every morning.        DISCHARGE INSTRUCTIONS:  1.  Follow-up with PCP in 5 days 2.  Continue prednisone and Tamiflu for a total 5-day course of each 3.  Needs to follow-up with pulmonology for PFTs 4.  Started on Spiriva 1 puff daily DIET:  Cardiac diet DISCHARGE CONDITION:  Stable ACTIVITY:  Activity as tolerated OXYGEN:  Home Oxygen: No.  Oxygen Delivery: room air DISCHARGE LOCATION:  home   If you experience worsening of your admission symptoms, develop shortness of breath,  life threatening emergency, suicidal or homicidal thoughts you must seek medical attention immediately by calling 911 or calling your MD immediately  if symptoms less severe.  You Must read complete instructions/literature along with all the possible adverse reactions/side effects for all the Medicines you take and that have been prescribed to you. Take any new Medicines after you have completely understood and accpet  all the possible adverse reactions/side effects.   Please note  You were cared for by a hospitalist during your hospital stay. If you have any questions about your discharge medications or the care you received while you were in the hospital after you are discharged, you can call the unit and asked to speak with the hospitalist on call if the hospitalist that took care of you is not available. Once you are discharged, your primary care physician will handle any further medical issues. Please note that NO REFILLS for any discharge medications will be authorized once you are discharged, as it is imperative that you return to your primary care physician (or establish a relationship with a primary care physician if you do not have one) for your aftercare needs so that they can reassess your need for medications and monitor your lab values.    On the day of Discharge:  VITAL SIGNS:  Blood pressure (!) 166/66, pulse 69, temperature 98.5 F (36.9 C), temperature source Oral, resp. rate 18, height 5\' 6"  (1.676 m), weight 70.3 kg, SpO2 93 %. PHYSICAL EXAMINATION:  GENERAL:  73 y.o.-year-old patient lying in the bed with no acute distress.  EYES: Pupils equal, round, reactive to light and accommodation. No scleral icterus. Extraocular muscles intact.  HEENT: Head atraumatic, normocephalic. Oropharynx and nasopharynx clear.  NECK:  Supple, no jugular venous distention. No thyroid enlargement, no tenderness.  LUNGS: + Mild diffuse rhonchi, no wheezing or crackles. No use of accessory muscles of respiration.  CARDIOVASCULAR: RRR, S1, S2 normal. No murmurs, rubs, or gallops.  ABDOMEN: Soft, non-tender, non-distended. Bowel sounds present. No organomegaly or mass.  EXTREMITIES: No pedal edema, cyanosis, or clubbing.  NEUROLOGIC: Cranial nerves II through XII are intact. Muscle strength 5/5 in all extremities. Sensation intact. Gait not checked.  PSYCHIATRIC: The patient is alert and oriented x 3.  SKIN: No  obvious rash, lesion, or ulcer.  DATA REVIEW:   CBC Recent Labs  Lab 03/20/18 0538  WBC 10.5  HGB 14.6  HCT 43.9  PLT 239    Chemistries  Recent Labs  Lab 03/19/18 0757 03/20/18 0538  NA 141 140  K 2.9* 4.4  CL 105 107  CO2 30 28  GLUCOSE 110* 146*  BUN 20 31*  CREATININE 1.46* 1.58*  CALCIUM 8.2* 9.0  AST 18  --   ALT 9  --   ALKPHOS 70  --   BILITOT 1.1  --      Microbiology Results  No results found for this or any previous visit.  RADIOLOGY:  No results found.   Management plans discussed with the patient, family and they are in agreement.  CODE STATUS: Full Code   TOTAL TIME TAKING CARE OF THIS PATIENT: 40 minutes.    Jinny Blossom  M.D on 03/21/2018 at 10:02 AM  Between 7am to 6pm - Pager - [phone]  After 6pm go to [email or web address] - Social research officer, government  [facility]  Office  [phone]  CC: Primary care physician; System, Pcp Not In   Note: This dictation was prepared with Dragon dictation along with smaller phrase  technology. Any transcriptional errors that result from this process are unintentional.

## 2020-04-24 IMAGING — CR DG CHEST 2V
2 series · 2 of 2 positions shown · non-contrast
Comparison: None.

CLINICAL DATA: Shortness of breath.

EXAM:
CHEST - 2 VIEW

[chest pa]
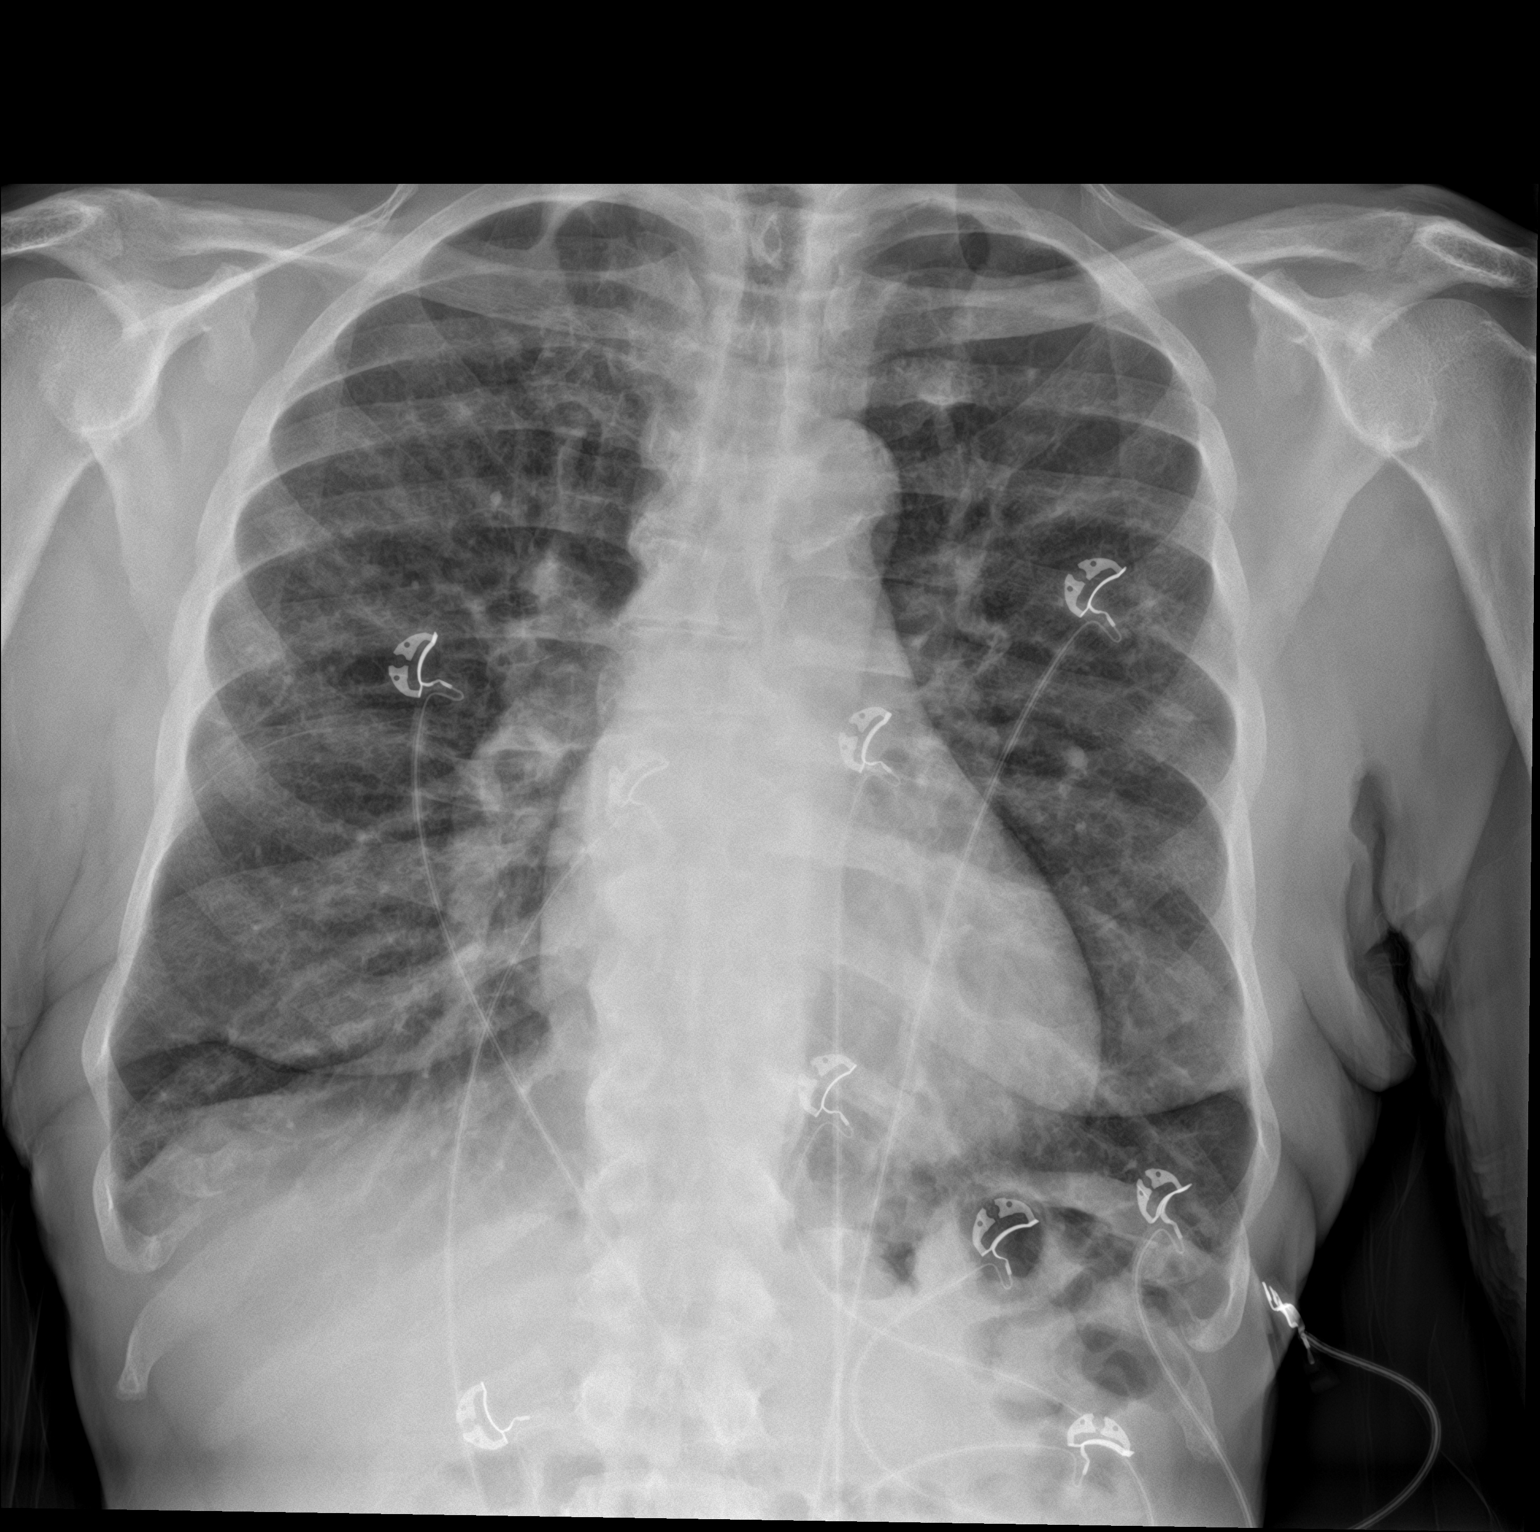

[chest lat]
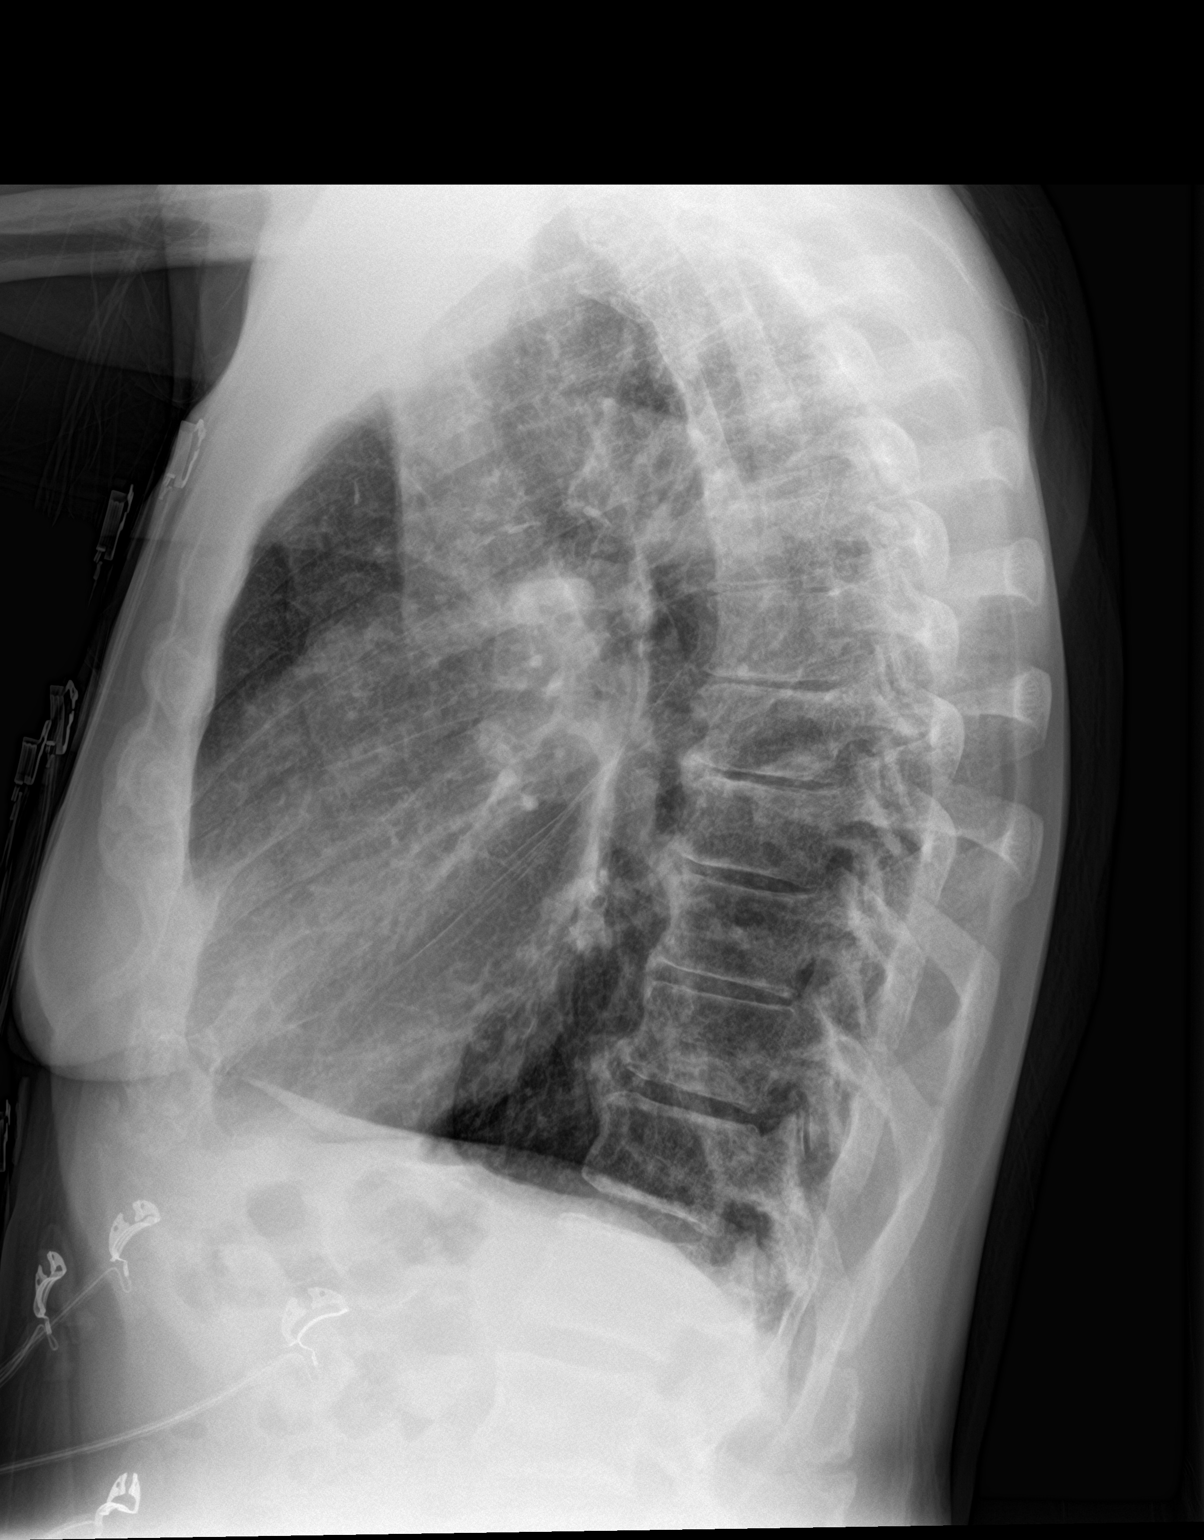

[2 of 2 positions shown; findings below may reference images not displayed]

FINDINGS: Lungs are mildly hyperexpanded. There are scattered areas of
scarring bilaterally. There is no apparent edema or consolidation.
Heart size and pulmonary vascularity are normal. No adenopathy.
There is aortic atherosclerosis. There is degenerative change in the
thoracic spine.
IMPRESSION: Lungs mildly hyperexpanded with scattered areas of scarring. No
edema or consolidation. Heart size normal.

Aortic Atherosclerosis ([id number]).

## 2022-04-05 DEATH — deceased
